# Patient Record
Sex: Male | Born: 1995
Health system: Southern US, Community
[De-identification: ages and names within clinical notes are randomized; demographics above are authoritative.]

## PROBLEM LIST (undated history)

## (undated) DIAGNOSIS — L505 Cholinergic urticaria: Principal | ICD-10-CM

## (undated) HISTORY — DX: Cholinergic urticaria: L50.5

---
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
## 2017-01-04 DIAGNOSIS — F902 Attention-deficit hyperactivity disorder, combined type: Secondary | ICD-10-CM | POA: Diagnosis not present

## 2017-01-09 MED FILL — BUPROPION HCL XL 150 MG TAB: 150 | 30 days supply | Qty: 30 | Fill #1

## 2017-01-19 MED FILL — VYVANSE 40 MG CAPSULE: 40 | 30 days supply | Qty: 30 | Fill #0

## 2017-02-05 DIAGNOSIS — Z130 Encounter for screening for diseases of the blood and blood-forming organs and certain disorders involving the immune mechanism: Secondary | ICD-10-CM | POA: Diagnosis not present

## 2017-02-20 DIAGNOSIS — Z713 Dietary counseling and surveillance: Secondary | ICD-10-CM | POA: Diagnosis not present

## 2017-02-20 DIAGNOSIS — F909 Attention-deficit hyperactivity disorder, unspecified type: Secondary | ICD-10-CM | POA: Diagnosis not present

## 2017-02-20 DIAGNOSIS — Z68.41 Body mass index (BMI) pediatric, 5th percentile to less than 85th percentile for age: Secondary | ICD-10-CM | POA: Diagnosis not present

## 2017-02-20 DIAGNOSIS — Z00129 Encounter for routine child health examination without abnormal findings: Secondary | ICD-10-CM | POA: Diagnosis not present

## 2017-02-20 DIAGNOSIS — Z79899 Other long term (current) drug therapy: Secondary | ICD-10-CM | POA: Diagnosis not present

## 2017-03-08 MED FILL — buPROPion HCL ER (XL) 150 M: 150 | 30 days supply | Qty: 30 | Fill #0

## 2017-03-08 MED FILL — VYVANSE 40 MG CAPSULE: 40 | 30 days supply | Qty: 30 | Fill #0

## 2017-03-30 DIAGNOSIS — F902 Attention-deficit hyperactivity disorder, combined type: Secondary | ICD-10-CM | POA: Diagnosis not present

## 2017-04-27 MED FILL — MONTELUKAST SOD 10 MG TAB: 10 | 30 days supply | Qty: 30 | Fill #0

## 2017-05-11 MED FILL — VYVANSE 40 MG CAPSULE: 40 | 30 days supply | Qty: 30 | Fill #0

## 2017-05-11 MED FILL — BUPROPION HCL XL 150 MG TAB: 150 | 30 days supply | Qty: 30 | Fill #1

## 2017-07-09 MED FILL — VYVANSE 40 MG CAPSULE: 40 | 30 days supply | Qty: 30 | Fill #0

## 2017-07-09 MED FILL — BUPROPION HCL XL 150 MG TAB: 150 | 30 days supply | Qty: 30 | Fill #0

## 2017-09-13 MED FILL — buPROPion HCL ER (XL) 150 M: 150 | 30 days supply | Qty: 30 | Fill #0

## 2017-09-13 MED FILL — VYVANSE 40 MG CAPSULE: 40 | 30 days supply | Qty: 30 | Fill #0

## 2018-03-19 DIAGNOSIS — F909 Attention-deficit hyperactivity disorder, unspecified type: Secondary | ICD-10-CM | POA: Diagnosis not present

## 2018-07-26 DIAGNOSIS — Z23 Encounter for immunization: Secondary | ICD-10-CM | POA: Diagnosis not present

## 2018-07-26 DIAGNOSIS — Z13220 Encounter for screening for lipoid disorders: Secondary | ICD-10-CM | POA: Diagnosis not present

## 2018-07-26 DIAGNOSIS — Z0000 Encounter for general adult medical examination without abnormal findings: Secondary | ICD-10-CM | POA: Diagnosis not present

## 2018-07-26 DIAGNOSIS — F909 Attention-deficit hyperactivity disorder, unspecified type: Secondary | ICD-10-CM | POA: Diagnosis not present

## 2018-08-05 ENCOUNTER — Ambulatory Visit (INDEPENDENT_AMBULATORY_CARE_PROVIDER_SITE_OTHER): Payer: 59 | Admitting: Psychiatry

## 2018-08-05 ENCOUNTER — Encounter: Payer: Self-pay | Admitting: Psychiatry

## 2018-08-05 VITALS — BP 134/90 | HR 68 | Ht 72.0 in | Wt 232.0 lb

## 2018-08-05 DIAGNOSIS — F902 Attention-deficit hyperactivity disorder, combined type: Secondary | ICD-10-CM

## 2018-08-05 DIAGNOSIS — F341 Dysthymic disorder: Secondary | ICD-10-CM | POA: Insufficient documentation

## 2018-08-05 DIAGNOSIS — F4010 Social phobia, unspecified: Secondary | ICD-10-CM | POA: Insufficient documentation

## 2018-08-05 MED ORDER — LISDEXAMFETAMINE DIMESYLATE 40 MG PO CAPS: 40.0000 mg | ORAL_CAPSULE | Freq: Every day | ORAL | 0 refills | Status: AC

## 2018-08-05 MED ORDER — BUPROPION HCL ER (XL) 150 MG PO TB24: 150.0000 mg | ORAL_TABLET | Freq: Every day | ORAL | 3 refills | Status: AC

## 2018-08-05 MED FILL — VYVANSE 40 MG CAPSULE: 40 | 30 days supply | Qty: 30 | Fill #0

## 2018-08-05 MED FILL — buPROPion HCL ER (XL) 150 M: 150 | 30 days supply | Qty: 30 | Fill #0

## 2018-08-05 NOTE — Progress Notes (Signed)
[facility]  Patient ID: Jonathon Jimenez,  MRN: [id number]  PCP: Estrella Myrtleavis, William B, MD  Date of Evaluation: 08/05/2018 Time spent:20 minutes  Chief Complaint:  Chief Complaint    Anxiety; Depression; ADHD      HISTORY/CURRENT STATUS: Melanee Jimenez is seen individually face-to-face with consent not collateral stating he attends appointment to renew his pharmacotherapy wishing transfer of such care to  Ren[facility]ill[facility]ald Polite[facility]  Patient states Dr. Nehemiah SettlePolite expects he would need psychiatrist to instruct transfer due to controlled substance.  I clarify for patient though when he calls mother on the cell phone in the session finding mother has no time or resource to discuss with the patient his dependant fixations including on session today.  The patient is 15 minutes late with psychomotor slowing stating again he [place] stating that he still takes Wellbutrin and Vyvanse they last requested as month supoly March 2019.  Last appointment here was 16 months ago 03/30/2017.  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  He does not clarify obtaining medications elsewhere but has likely relative to epic charting.  He is more appropriate with schedule being less depressed and anxious though still procrastinating with obsessive slowness and cluster C trait dependence.  He has no mania, psychosis, self-harm or suicidal ideation.  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  Depression         This is a chronic problem.  The current episode started more than 1 year ago.   The onset quality is gradual.   The problem occurs every several days.  The problem has been gradually improving since onset.  Associated symptoms include decreased concentration, fatigue, helplessness,  appetite change and sad.  Associated symptoms include no decreased interest and no suicidal ideas.     The symptoms are aggravated by work stress, medication, family issues and social issues.  Past treatments include other medications.   Individual Medical History/ Review of Systems: Changes? :Yes  Patient asking about Librarian, academicChristian counselor as did mother at frist appointment reiterating Elio Forgethris Andrews, [facility], Karmen BongoAaron Stewart and Nicole Cellalaude Ragan, PhD at [facility] and [facility], [place], [place].  Allergies: Patient has no known allergies.  Current Medications:  Current Outpatient Medications:  .  buPROPion (WELLBUTRIN XL) 150 MG 24 hr tablet, Take 1 tablet (150 mg total) by mouth daily after breakfast., Disp: 30 tablet, Rfl: 3 .  fexofenadine (ALLEGRA) 180 MG tablet, Take 1 tablet (180 mg total) by mouth 2 (two) times daily., Disp: 60 tablet, Rfl: 5 .  lisdexamfetamine (VYVANSE) 40 MG capsule, Take 1 capsule (40 mg total) by mouth daily after breakfast for 30 days., Disp: 30 capsule, Rfl: 0 .  [START ON 09/04/2018] lisdexamfetamine (VYVANSE) 40 MG capsule, Take 1 capsule (40 mg total) by mouth daily after breakfast for 30 days., Disp: 30 capsule, Rfl: 0 .  [START ON 10/04/2018] lisdexamfetamine (VYVANSE) 40 MG capsule, Take 1 capsule (40 mg total) by mouth daily after breakfast for 30 days., Disp: 30 capsule, Rfl: 0 .  ranitidine (ZANTAC) 150 MG tablet, Take 1 tablet (150 mg total) by mouth 2 (two) times daily., Disp: 60 tablet, Rfl: 5   Medication Side Effects: none  Family Medical/ Social History: Changes? 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  General Appearance: Casual, Fairly Groomed, Guarded and Obese  Eye  Contact:  Fair  Speech:  Garbled and Slow  Volume:  Increased  Mood:  Anxious, Dysphoric and Worthless  Affect:  Constricted, Inappropriate and Anxious  Thought Process:  Disorganized and Goal Directed  Orientation:  Full (Time, Place, and Person)  Thought Content: Obsessions   Suicidal Thoughts:  No  Homicidal Thoughts:  No  Memory:  Immediate;   Fair Remote;   Fair  Judgement:  Impaired  Insight:  Lacking  Psychomotor Activity:  Decreased  Concentration:  Concentration: Fair and Attention Span: Poor  Recall:  [employer] of Knowledge: Fair  Language: Fair  Assets:  Talents/Skills Transportation  ADL's:  Intact  Cognition: Impaired,  Mild provisionally patient presents patient has been limited here now and his fourth appointment in 20 months.  Prognosis:  Fair    DIAGNOSES:    ICD-10-CM   1. Attention deficit hyperactivity disorder (ADHD), combined type, moderate F90.2 buPROPion (WELLBUTRIN XL) 150 MG 24 hr tablet    lisdexamfetamine (VYVANSE) 40 MG capsule    lisdexamfetamine (VYVANSE) 40 MG capsule    lisdexamfetamine (VYVANSE) 40 MG capsule  2. 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 Social anxiety disorder F40.10 buPROPion (WELLBUTRIN XL) 150 MG 24 hr tablet    Receiving Psychotherapy: No    RECOMMENDATIONS: Patient is supported in his transfer of care as mother offers no opinion during her inclusion on the cell phone briefly.  Consent is signed and records will be forwarded to Dr. Nehemiah Settle, patient inquiring if he can return here if he needs to.  Christian therapists are clarified to him though he asked the same question in the past and did not follow through.  He is prescribed to continue Vyvanse 40 mg # 30 each for January, February, and March to [facility] outpatient pharmacy for ADHD.  He continues Wellbutrin 150 mg XL every  morning #30 with 3 refills for anxiety, depression and ADHD somewhat improved, but cluster C traits are more prominent in his procrastination and slowing, session overall raising question of possible need for cognitive testing in case borderline low.  He will return here as needed and may well function more maturely and efficaciously in other settings I cannot document today.   Chauncey Mann, MD

## 2018-09-05 DIAGNOSIS — E7800 Pure hypercholesterolemia, unspecified: Secondary | ICD-10-CM | POA: Diagnosis not present

## 2020-04-28 ENCOUNTER — Encounter: Payer: Self-pay | Admitting: Psychiatry
